# Patient Record
Sex: Female | Born: 1965 | Race: White | Hispanic: No | Marital: Married | Smoking: Former smoker
Health system: Southern US, Community
[De-identification: ages and names within clinical notes are randomized; demographics above are authoritative.]

## PROBLEM LIST (undated history)

## (undated) DIAGNOSIS — E079 Disorder of thyroid, unspecified: Secondary | ICD-10-CM

## (undated) HISTORY — PX: TUBAL LIGATION: SHX77

## (undated) HISTORY — DX: Disorder of thyroid, unspecified: E07.9

## (undated) HISTORY — PX: CARPAL TUNNEL RELEASE: SHX101

## (undated) HISTORY — PX: THYROIDECTOMY: SHX17

---

## 2009-05-10 ENCOUNTER — Ambulatory Visit (HOSPITAL_COMMUNITY): Admission: RE | Admit: 2009-05-10 | Discharge: 2009-05-11 | Payer: Self-pay | Admitting: Specialist

## 2010-02-09 ENCOUNTER — Inpatient Hospital Stay (HOSPITAL_COMMUNITY): Admission: RE | Admit: 2010-02-09 | Discharge: 2010-02-10 | Payer: Self-pay | Admitting: Specialist

## 2011-01-03 LAB — PREGNANCY, URINE: Preg Test, Ur: NEGATIVE

## 2011-01-03 LAB — CBC
HCT: 37 % (ref 36.0–46.0)
Hemoglobin: 12.7 g/dL (ref 12.0–15.0)
MCV: 91.5 fL (ref 78.0–100.0)
Platelets: 255 10*3/uL (ref 150–400)
WBC: 6.9 10*3/uL (ref 4.0–10.5)

## 2011-01-03 LAB — BASIC METABOLIC PANEL
BUN: 17 mg/dL (ref 6–23)
Chloride: 101 mEq/L (ref 96–112)
GFR calc non Af Amer: 57 mL/min — ABNORMAL LOW (ref >60)
Potassium: 4.2 mEq/L (ref 3.5–5.1)
Sodium: 139 mEq/L (ref 135–145)

## 2011-01-22 LAB — BASIC METABOLIC PANEL
BUN: 12 mg/dL (ref 6–23)
CO2: 28 mEq/L (ref 19–32)
Chloride: 105 mEq/L (ref 96–112)
Creatinine, Ser: 0.88 mg/dL (ref 0.4–1.2)
Glucose, Bld: 93 mg/dL (ref 70–99)
Potassium: 4.1 mEq/L (ref 3.5–5.1)

## 2011-01-22 LAB — CBC
HCT: 38.2 % (ref 36.0–46.0)
MCHC: 33.9 g/dL (ref 30.0–36.0)
MCV: 91 fL (ref 78.0–100.0)
Platelets: 255 10*3/uL (ref 150–400)
RDW: 13.9 % (ref 11.5–15.5)
WBC: 7.7 10*3/uL (ref 4.0–10.5)

## 2011-01-22 LAB — PREGNANCY, URINE: Preg Test, Ur: NEGATIVE

## 2011-02-28 NOTE — Op Note (Signed)
Meagan Jimenez, Meagan Jimenez                ACCOUNT NO.:  [id number]   MEDICAL RECORD NO.:  [id number]          PATIENT TYPE:  AMB   LOCATION:  DAY                          FACILITY:  [facility]   PHYSICIAN:  Jene Every, M.D.    DATE OF BIRTH:  Apr 11, 1966   DATE OF PROCEDURE:  05/10/2009  DATE OF DISCHARGE:                               OPERATIVE REPORT   PREOPERATIVE DIAGNOSIS:  Rotator cuff tear massive, of left shoulder.   POSTOPERATIVE DIAGNOSIS:  Rotator cuff tear massive, of left shoulder.   PROCEDURE PERFORMED:  1. Mini open repair of massive rotator cuff tear utilizing a striker      suture anchors and push lock.  2. Subacromial decompression.  3. Bursectomy.   ANESTHESIA:  General.   SURGEON:  Jene Every, M.D.   ASSISTANT:  Roma Schanz, P.A.   TECHNIQUE:  The operation's degree of difficulty increased to the  patient's extreme morbid obesity and a massive chronic tear.  The  patient in supine beach-chair position after induction of adequate  general anesthesia and 1 gram of vanco, left shoulder and upper  extremity was prepped and draped in the usual sterile fashion.  Incision  was made over the anterolateral aspect of the acromion.  Subcutaneous  tissue was dissected.  Electrocautery utilized to achieve hemostasis.  The raphe between the anterolateral heads was identified,  subperiosteally elevated from the anterolateral and anteromedial aspect  of the acromion preserving the attachment of the deltoid.  CA ligament  was identified and divided, acromioplasty performed with a oscillating  saw.  Removed with a 3 mm Kerrison.  Hypertrophic bursa was excised.  There was a massive complex tear of the rotator cuff included subscap,  supraspinatus, infraspinatus.  The edges were trimmed.  We mobilized the  tissue as best possible.  I created a bleeding bed medial to the greater  tuberosity, had to remove some of the articular surface approximately  0.5 cm due to the minimal  advancement of the tendon that occurred due to  the chronicity.  We did release some of the tendon at the glenohumeral  labrum.  This was gently mobilized.  We digitally lysed adhesions.  Then  put 3 suture anchors at the articular margin from just lateral to the  bicipital groove, middle substance of the supraspinatus and laterally  for the infraspinatus.  Tendon was advanced into the trough.  We tied a  suture knot.  We tied it each over the suture anchor with a good  surgeon's knot.  We then saved the suture following that, crossed them  and put them into push lock suture anchors over the lateral aspect of  the greater tuberosity.  The suture anchors at the articular margin  wounds were placed utilizing awl insertion of the suture anchor.  Excellent purchase was obtained.  Same with the push locks.  The crossed  threaded suture ends were then placed through the push lock and into the  lower portion of the outer aspect of the greater tuberosity with  appropriate tensioning.  This covered the bed that was made and brought  the tendon down to a good watertight seal.  Good tension from utilizing  the push locks was done and the suture was then removed.  We ranged the  shoulder and there was good range, good attachment of the rotator cuff  tendon.  Wound copiously irrigated.  Excess bursa was excised.  There  was significant degree of difficulty added due to the patient's obesity  and the retracted tear and need for mobilization of the cuff.  When we  were finished, we had good coverage.  Next, the wound was copiously  irrigated.  We repaired the raphe with #1 Vicryl interrupted figure-of-  eight sutures over top through the acromion, subcu with 2-0 Vicryl  simple sutures.  Skin was reapproximated with 4-0 subcuticular Prolene.  Wound reinforced with Steri-Strips.  Sterile dressing applied.  Placed  in a sling,abduction pillow, extubated without difficulty and  transported to recovery in  satisfactory condition.   The patient tolerated the procedure well.  There were no complications.   MINIMAL BLOOD LOSS:  50 mL.      Jene Every, M.D.  Electronically Signed     JB/MEDQ  D:  05/10/2009  T:  05/11/2009  Job:  [id number]

## 2012-01-09 ENCOUNTER — Other Ambulatory Visit (HOSPITAL_COMMUNITY): Payer: Self-pay | Admitting: Orthopedic Surgery

## 2012-01-09 DIAGNOSIS — S46009A Unspecified injury of muscle(s) and tendon(s) of the rotator cuff of unspecified shoulder, initial encounter: Secondary | ICD-10-CM

## 2012-01-12 ENCOUNTER — Ambulatory Visit (HOSPITAL_COMMUNITY)
Admission: RE | Admit: 2012-01-12 | Discharge: 2012-01-12 | Disposition: A | Discharge status: Home / Self Care | Payer: Worker's Compensation | Source: Ambulatory Visit | Attending: Orthopedic Surgery | Admitting: Orthopedic Surgery

## 2012-01-12 DIAGNOSIS — S46009A Unspecified injury of muscle(s) and tendon(s) of the rotator cuff of unspecified shoulder, initial encounter: Secondary | ICD-10-CM

## 2012-01-12 DIAGNOSIS — S4980XA Other specified injuries of shoulder and upper arm, unspecified arm, initial encounter: Secondary | ICD-10-CM | POA: Insufficient documentation

## 2012-01-12 DIAGNOSIS — X58XXXA Exposure to other specified factors, initial encounter: Secondary | ICD-10-CM | POA: Insufficient documentation

## 2012-01-12 DIAGNOSIS — S46909A Unspecified injury of unspecified muscle, fascia and tendon at shoulder and upper arm level, unspecified arm, initial encounter: Secondary | ICD-10-CM | POA: Insufficient documentation

## 2012-01-12 MED ORDER — IOHEXOL 300 MG/ML  SOLN
10.0000 mL | Freq: Once | INTRAMUSCULAR | Status: AC | PRN
  Administered 2012-01-12: 10 mL via INTRATHECAL

## 2012-01-12 MED ORDER — GADOBENATE DIMEGLUMINE 529 MG/ML IV SOLN
0.5000 mL | Freq: Once | INTRAVENOUS | Status: AC | PRN
  Administered 2012-01-12: 0.5 mL via INTRAVENOUS

## 2012-01-12 NOTE — Procedures (Signed)
Left shoulder injection for MRI.  Full report dictated.

## 2012-01-15 ENCOUNTER — Telehealth (HOSPITAL_COMMUNITY): Payer: Self-pay

## 2013-06-09 ENCOUNTER — Telehealth: Payer: Self-pay | Admitting: *Deleted

## 2013-06-09 NOTE — Telephone Encounter (Signed)
pts husband called about a refill for the citolapram, he said you have been prescribing for her, I called her pharmacy and they said it was written by you back in October of 2013, I asked you about it Thursday and you said for her to get it from her primary Dr. Quincy Carnes say she has no primary Dr. and would appreciate it if you will fill it again, please advise.

## 2013-06-10 ENCOUNTER — Other Ambulatory Visit: Payer: Self-pay | Admitting: *Deleted

## 2013-06-10 MED ORDER — CITALOPRAM HYDROBROMIDE 40 MG PO TABS: 60.0000 mg | ORAL_TABLET | Freq: Every day | ORAL | Status: DC

## 2013-06-10 NOTE — Telephone Encounter (Signed)
Ok, she needs her annual f/u scheduled with TSH level in October and records requested

## 2013-08-12 ENCOUNTER — Other Ambulatory Visit: Payer: Self-pay

## 2013-08-14 ENCOUNTER — Ambulatory Visit: Payer: Self-pay | Admitting: Endocrinology

## 2013-08-19 ENCOUNTER — Other Ambulatory Visit: Payer: Self-pay

## 2013-08-21 ENCOUNTER — Ambulatory Visit: Payer: Self-pay | Admitting: Endocrinology

## 2013-08-31 IMAGING — RF DG FLUORO GUIDE NDL PLC/BX
6 series · 6 of 6 positions shown · IV contrast (multihance)
Comparison: none

Clincial Data: Recurrent shoulder pain and weakness. History of
shoulder surgery x2 for rotator cuff tear.

LEFT SHOULDER INJECTION FOR MRI UNDER FLUOROSCOPY
TECHNIQUE: Time-out procedure was performed.  The procedure and
potential complications (bleeding, infection, contrast reaction)
were discussed with the patient.  Informed written consent was
obtained.  An appropriate skin entrance site was determined. The
site was marked, prepped with Betadine, draped in the usual sterile
fashion, and infiltrated locally with 1% Lidocaine.  A 22 gauge
spinal needle was advanced to the superomedial margin of the
humeral head under intermittent fluoroscopy.   10 ml of a mixture
of 0.06 ml Multihance in 10 ml of Fmnipaque-CSS and 2 ml of 1%
Lidocaine was then used to opacify the left shoulder capsule.
There were no immediate complications.
Fluoroscopy time: 0.5 minutes.

[Series 1: run · 1 of 1 slices shown (1 of 6)]
[im 1/1]
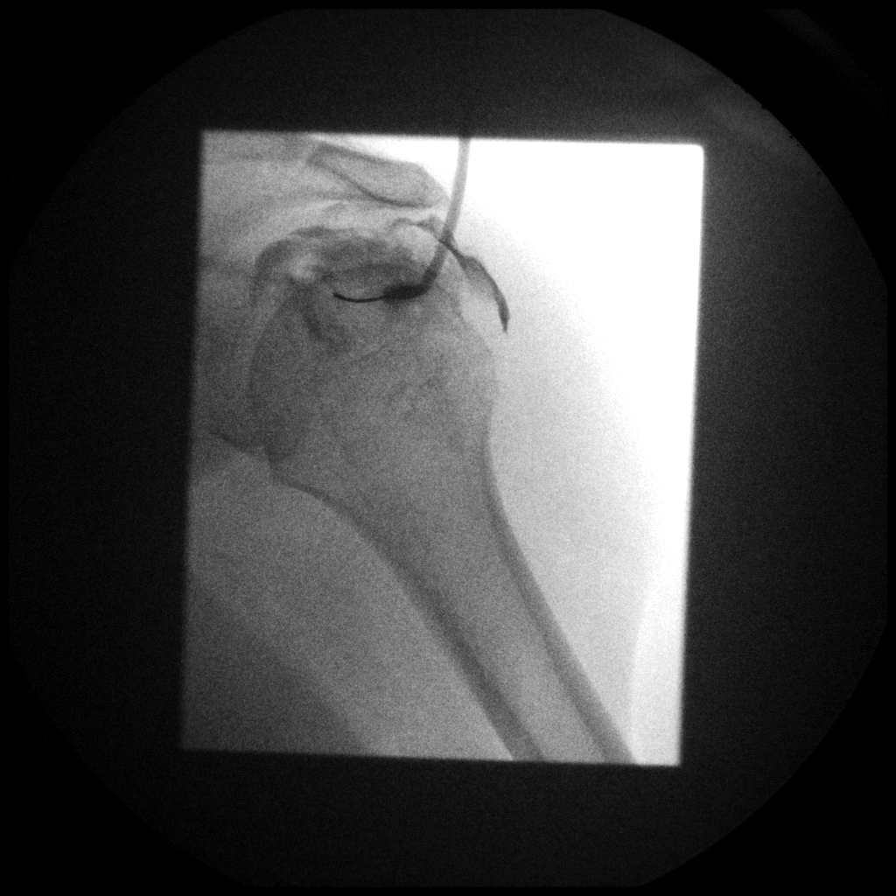

[Series 2: run · 1 of 1 slices shown (2 of 6)]
[im 1/1]
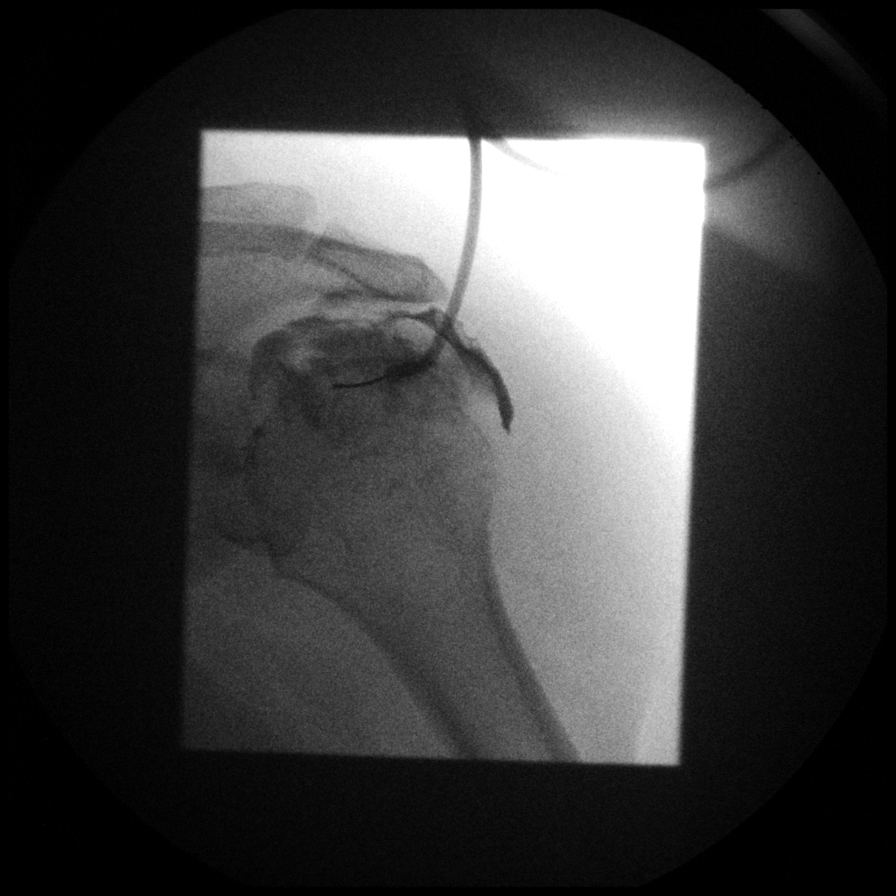

[Series 3: run · 1 of 1 slices shown (3 of 6)]
[im 1/1]
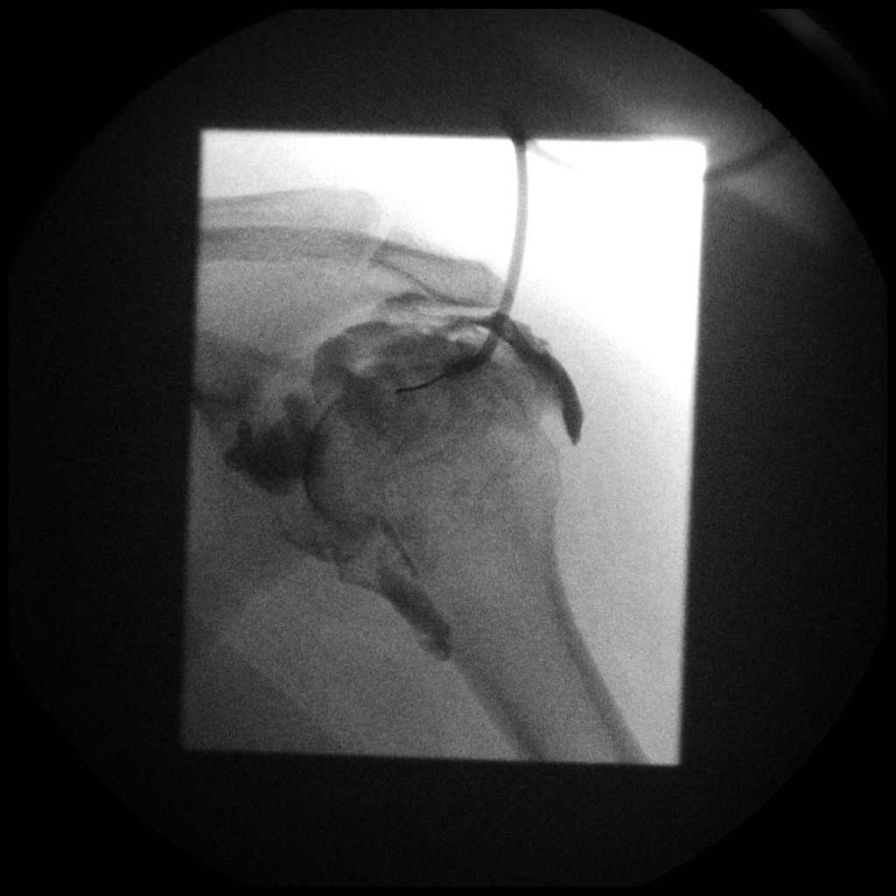

[Series 4: run · 1 of 1 slices shown (4 of 6)]
[im 1/1]
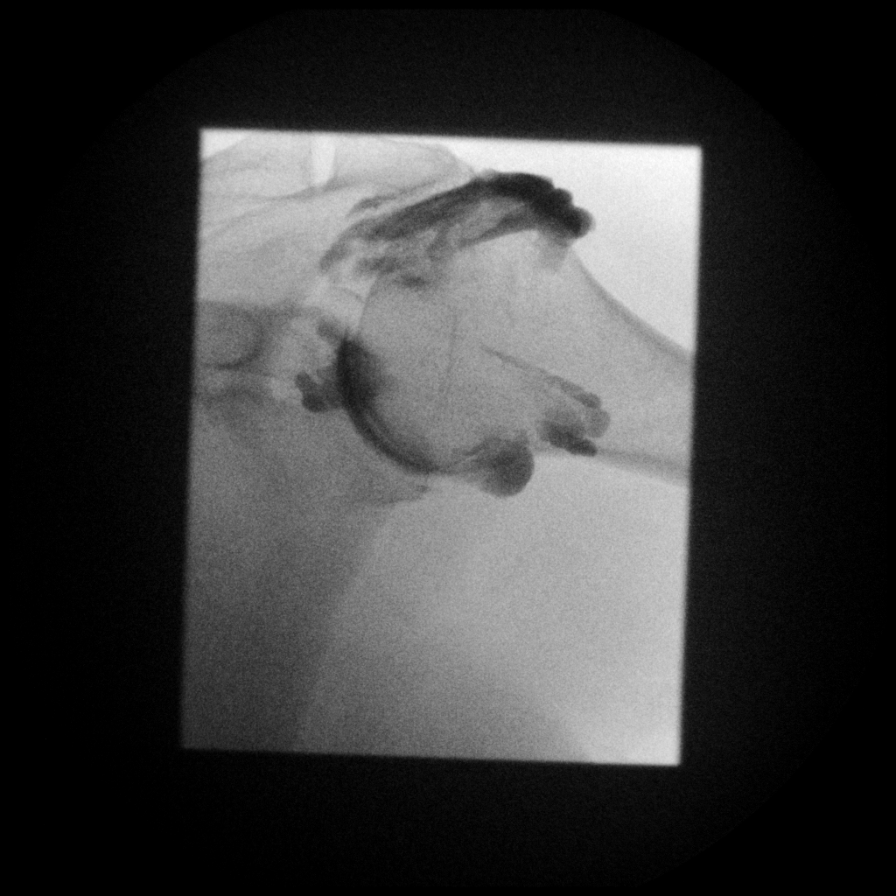

[Series 5: run · 1 of 1 slices shown (5 of 6)]
[im 1/1]
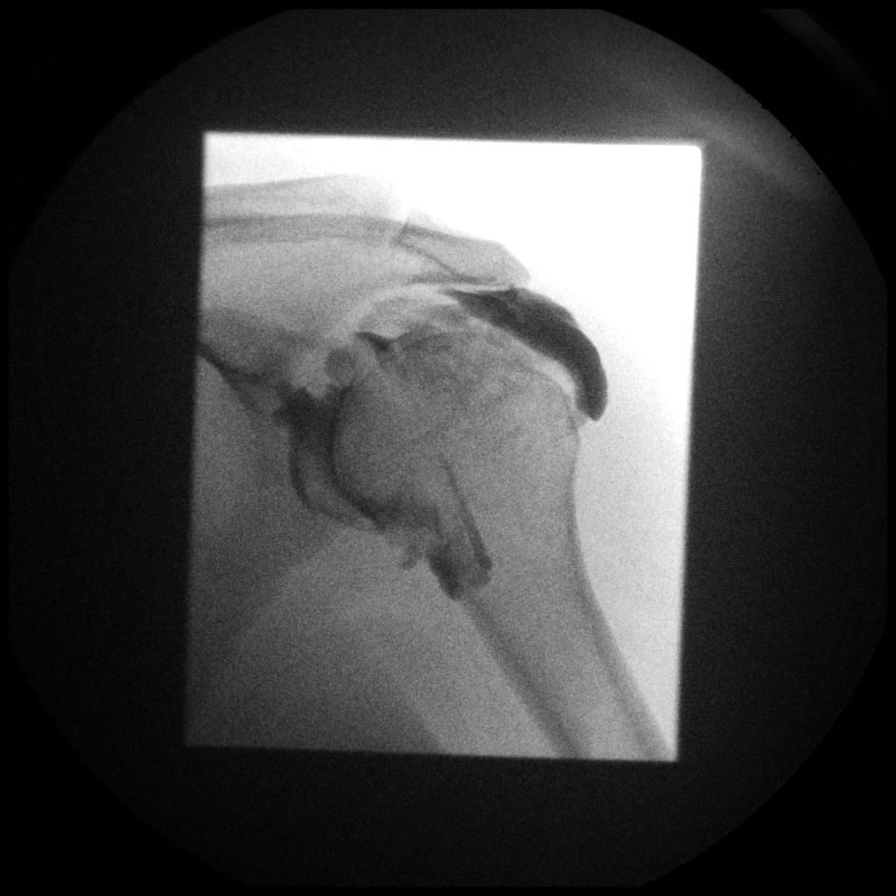

[Series 6: run · 1 of 1 slices shown (6 of 6)]
[im 1/1]
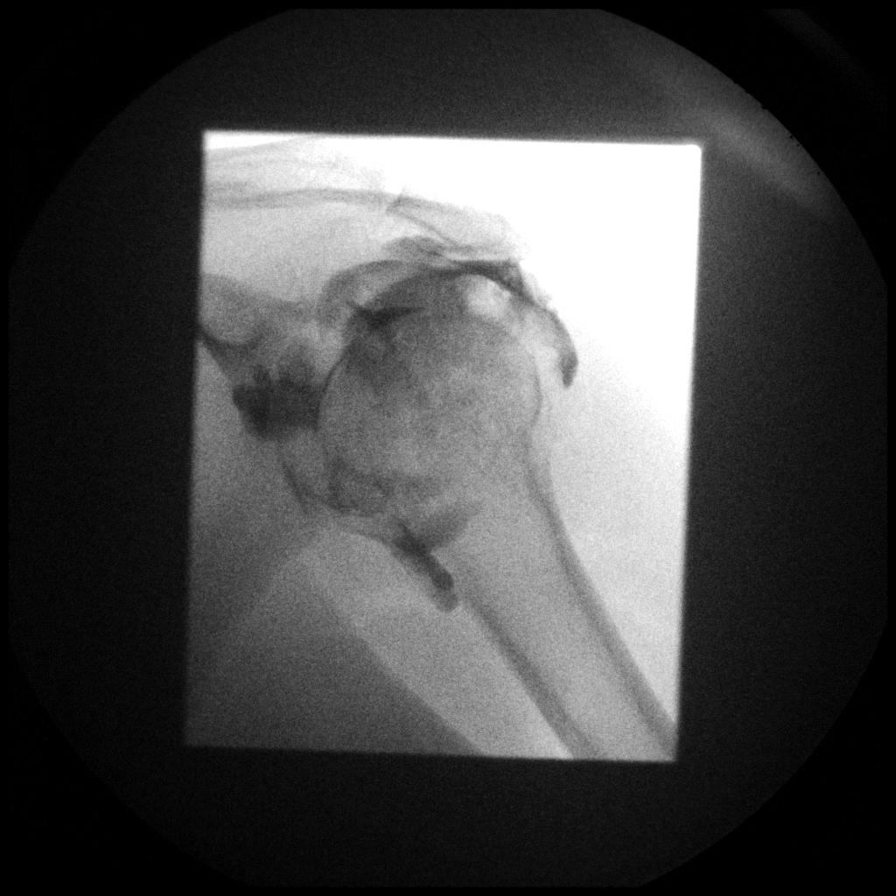

[6 of 6 positions shown; findings below may reference images not displayed]

IMPRESSION: Technically successful left shoulder injection for MRI.  The
immediate fluoroscopic images demonstrate rapid passage of contrast
from the glenohumeral joint into the subacromial space.  Images
obtained after needle removal demonstrate contrast dispersed
throughout the glenohumeral joint and subacromial - subdeltoid
bursa.

## 2013-09-17 ENCOUNTER — Other Ambulatory Visit: Payer: Self-pay

## 2013-09-17 ENCOUNTER — Other Ambulatory Visit: Payer: Self-pay | Admitting: *Deleted

## 2013-09-17 ENCOUNTER — Other Ambulatory Visit (INDEPENDENT_AMBULATORY_CARE_PROVIDER_SITE_OTHER): Payer: Self-pay

## 2013-09-17 DIAGNOSIS — E039 Hypothyroidism, unspecified: Secondary | ICD-10-CM

## 2013-09-19 ENCOUNTER — Encounter: Payer: Self-pay | Admitting: Endocrinology

## 2013-09-19 ENCOUNTER — Ambulatory Visit (INDEPENDENT_AMBULATORY_CARE_PROVIDER_SITE_OTHER): Payer: Self-pay | Admitting: Endocrinology

## 2013-09-19 VITALS — BP 148/98 | HR 79 | Temp 98.6°F | Resp 14 | Ht 65.0 in | Wt 321.5 lb

## 2013-09-19 DIAGNOSIS — R030 Elevated blood-pressure reading, without diagnosis of hypertension: Secondary | ICD-10-CM

## 2013-09-19 DIAGNOSIS — E039 Hypothyroidism, unspecified: Secondary | ICD-10-CM

## 2013-09-19 DIAGNOSIS — R7309 Other abnormal glucose: Secondary | ICD-10-CM

## 2013-09-19 DIAGNOSIS — R7303 Prediabetes: Secondary | ICD-10-CM

## 2013-09-19 LAB — BASIC METABOLIC PANEL
BUN: 13 mg/dL (ref 6–23)
CO2: 26 mEq/L (ref 19–32)
Calcium: 8.2 mg/dL — ABNORMAL LOW (ref 8.4–10.5)
Chloride: 103 mEq/L (ref 96–112)
Creatinine, Ser: 0.8 mg/dL (ref 0.4–1.2)
Glucose, Bld: 115 mg/dL — ABNORMAL HIGH (ref 70–99)

## 2013-09-19 MED ORDER — LEVOTHYROXINE SODIUM 200 MCG PO TABS: 200.0000 ug | ORAL_TABLET | Freq: Every day | ORAL | Status: DC

## 2013-09-19 MED ORDER — GABAPENTIN 300 MG PO CAPS: 300.0000 mg | ORAL_CAPSULE | Freq: Three times a day (TID) | ORAL | Status: DC

## 2013-09-19 MED ORDER — RAMIPRIL 5 MG PO CAPS: 5.0000 mg | ORAL_CAPSULE | Freq: Every day | ORAL | Status: DC

## 2013-09-19 NOTE — Patient Instructions (Addendum)
Gabapentin 300mg   upto 3x daily  Ramipril 5mg  daily for BP

## 2013-09-19 NOTE — Progress Notes (Signed)
Patient ID: Meagan Jimenez, female   DOB: 11-Nov-1965, 47 y.o.   MRN: [id number]   Reason for Appointment:  Hypothyroidism, followup visit    History of Present Illness:   The hypothyroidism was first diagnosed in 2009 She had total thyroidectomy in 8/09 because of a goiter which was very uncomfortable.  The patient has been treated with generic Synthroid and has been on a stable dose of 200 mcg for some time Recent records are not available   Patient has no complaints of unusual fatigue, cold sensitivity, dry skin, unusual weight gain or hair loss.            The patient is taking the thyroid supplement very regularly in the morning before breakfast. Not taking any calcium or iron supplements with the thyroid supplement.    On the last visit the dose was not changed  Appointment on 09/17/2013  Component Date Value Range Status  . TSH 09/17/2013 1.33  0.35 - 5.50 uIU/mL Final  . Free T4 09/17/2013 0.99  0.60 - 1.60 ng/dL Final   PROBLEM 2: She says that in the past she would have symptoms suggestive of hypoglycemia. Over the last few months she has been checking her glucose at home because of family history of diabetes. Using Embrace meter with mail-order strips which her husband was using. She thinks that occasionally blood sugars are over 200 but other times near normal. Checking sporadically and none in the last week. She thinks she has had increased thirst and frequent urination at night but no recent weight loss or blurred vision She usually drinking diet drinks but sometimes drinking fruit punch  Glucose in the office today is 131, 2 hours after eating a breakfast sandwich  PROBLEM 3: She has had neuropathy for a few years. She has numbness and discomfort in her feet and hands. She has difficulty walking because of pain in her feet and has numbness in her toes with very little sensation distally Unable to work currently She has not seen a neurologist May have taking gabapentin  previously from her PCP  Problem 4: She still has not found a primary care physician for her general medical care    Medication List       This list is accurate as of: 09/19/13 11:02 AM.  Always use your most recent med list.               citalopram 40 MG tablet  Commonly known as:  CELEXA  Take 1.5 tablets (60 mg total) by mouth daily.     levothyroxine 200 MCG tablet  Commonly known as:  SYNTHROID, LEVOTHROID  Take 200 mcg by mouth daily before breakfast.        Allergies:  Allergies  Allergen Reactions  . Aspirin Nausea Only  . Ciprocinonide [Fluocinolone]     Causes hallucinations  . Penicillins Hives    History reviewed. No pertinent past medical history.  No past surgical history on file.  No family history on file.  Social History:  reports that she quit smoking about 8 years ago. She has never used smokeless tobacco. Her alcohol and drug histories are not on file.  REVIEW Of SYSTEMS:  Psoriatic arthritis  No history of hypertension but her blood pressure is unusually high today   Examination:   BP 148/98  Pulse 79  Temp(Src) 98.6 F (37 C)  Resp 14  Ht 5\' 5"  (1.651 m)  Wt 321 lb 8 oz (145.831 kg)  BMI 53.50 kg/m2  SpO2 96%  LMP 09/12/2013 Blood pressure unchanged on repeat measurement GENERAL APPEARANCE: Alert, no puffiness of the face or eyes     NEUROLOGIC EXAM:  biceps reflexes show normal relaxation Skin: Not unusual dry    Assessment   Hypothyroidism, postsurgical with normal TSH  Not clear if she has early diabetes, glucose is fairly good today.  She may not have an accurate glucose meter at home.  Her neuropathy may be from diabetes and need symptomatic control  Hypertension, currently untreated   Treatment:  Synthroid dose to be continued unchanged before breakfast daily.   Trial of gabapentin for symptomatic relief of her neuropathy  Check A1c Check basic metabolic panel  Start ramipril 5 mg daily for hypertension,  will need followup  Again recommended that she get a primary care physician for general care  [facility] 09/19/2013, 11:02 AM

## 2013-09-30 ENCOUNTER — Other Ambulatory Visit: Payer: Self-pay | Admitting: Endocrinology

## 2014-05-15 ENCOUNTER — Other Ambulatory Visit: Payer: Self-pay | Admitting: *Deleted

## 2014-05-15 ENCOUNTER — Telehealth: Payer: Self-pay | Admitting: *Deleted

## 2014-05-15 NOTE — Telephone Encounter (Signed)
Patient requested refill of citolapram and levothyroxine, rx was denied, patient needs to be seen in office for refills

## 2014-05-20 ENCOUNTER — Telehealth: Payer: Self-pay | Admitting: Endocrinology

## 2014-05-20 NOTE — Telephone Encounter (Signed)
Patient cannot get refills until she is seen in office, she's way past due for O/V, rx faxed to pharmacy letting them know.

## 2014-05-20 NOTE — Telephone Encounter (Signed)
Patient would like a refill on meds Levothyroxine 200 mg and Citalopram 40 mg, he stated that pharmacy faxed over request for meds but haven't heard anything back.

## 2014-05-22 ENCOUNTER — Ambulatory Visit (INDEPENDENT_AMBULATORY_CARE_PROVIDER_SITE_OTHER): Payer: Worker's Compensation | Admitting: Endocrinology

## 2014-05-22 ENCOUNTER — Encounter: Payer: Self-pay | Admitting: Endocrinology

## 2014-05-22 VITALS — BP 120/89 | HR 73 | Temp 98.3°F | Resp 16 | Ht 65.0 in | Wt 311.8 lb

## 2014-05-22 DIAGNOSIS — E039 Hypothyroidism, unspecified: Secondary | ICD-10-CM

## 2014-05-22 DIAGNOSIS — R1013 Epigastric pain: Secondary | ICD-10-CM

## 2014-05-22 LAB — CBC
HCT: 40.4 % (ref 36.0–46.0)
Hemoglobin: 13.4 g/dL (ref 12.0–15.0)
MCHC: 33.2 g/dL (ref 30.0–36.0)
MCV: 86.8 fl (ref 78.0–100.0)
Platelets: 243 10*3/uL (ref 150.0–400.0)
RBC: 4.65 Mil/uL (ref 3.87–5.11)
RDW: 15.4 % (ref 11.5–15.5)
WBC: 6.9 10*3/uL (ref 4.0–10.5)

## 2014-05-22 LAB — TSH: TSH: 0.9 u[IU]/mL (ref 0.35–4.50)

## 2014-05-22 NOTE — Patient Instructions (Signed)
OTC Nexium/ Prilosec or Prevacid  Vitamin D3, 2000 units daily

## 2014-05-22 NOTE — Progress Notes (Signed)
Patient ID: Meagan Jimenez, female   DOB: 1966/01/14, 48 y.o.   MRN: [id number]   Reason for Appointment:  Hypothyroidism, followup visit    History of Present Illness:   The hypothyroidism was first diagnosed in 2009 She had total thyroidectomy in 8/09 because of a multinodular goiter which was causing local discomfort  The patient has been treated with generic Synthroid and has been on a stable dose of 200 mcg for some time She complains of feeling more tired for the last 3 months Recent weight gain not present   The patient is taking the thyroid supplement very regularly in the morning before breakfast. Not taking any calcium or iron supplements with the thyroid supplement.    On the last visit the dose was not changed  Wt Readings from Last 3 Encounters:  05/22/14 311 lb 12.8 oz (141.432 kg)  09/19/13 321 lb 8 oz (145.831 kg)    Lab Results  Component Value Date   FREET4 0.99 09/17/2013   TSH 1.33 09/17/2013     No visits with results within 1 Week(s) from this visit. Latest known visit with results is:  Office Visit on 09/19/2013  Component Date Value Ref Range Status  . Hemoglobin A1C 09/19/2013 6.8* 4.6 - 6.5 % Final   Glycemic Control Guidelines for People with Diabetes:Non Diabetic:  <6%Goal of Therapy: <7%Additional Action Suggested:  >8%   . Sodium 09/19/2013 138  135 - 145 mEq/L Final  . Potassium 09/19/2013 3.9  3.5 - 5.1 mEq/L Final  . Chloride 09/19/2013 103  96 - 112 mEq/L Final  . CO2 09/19/2013 26  19 - 32 mEq/L Final  . Glucose, Bld 09/19/2013 115* 70 - 99 mg/dL Final  . BUN 40/98/119112/02/2013 13  6 - 23 mg/dL Final  . Creatinine, Ser 09/19/2013 0.8  0.4 - 1.2 mg/dL Final  . Calcium 47/82/956212/02/2013 8.2* 8.4 - 10.5 mg/dL Final  . GFR 13/08/657812/02/2013 85.93  >60.00 mL/min Final  . POC Glucose 09/19/2013 131* 70 - 99 mg/dl Final       Medication List       This list is accurate as of: 05/22/14  1:24 PM.  Always use your most recent med list.               citalopram 40 MG tablet  Commonly known as:  CELEXA  TAKE ONE & ONE-HALF TABLETS BY MOUTH ONCE DAILY     gabapentin 300 MG capsule  Commonly known as:  NEURONTIN  Take 1 capsule (300 mg total) by mouth 3 (three) times daily.     levothyroxine 200 MCG tablet  Commonly known as:  SYNTHROID, LEVOTHROID  Take 1 tablet (200 mcg total) by mouth daily before breakfast.     ramipril 5 MG capsule  Commonly known as:  ALTACE  Take 1 capsule (5 mg total) by mouth daily.        Allergies:  Allergies  Allergen Reactions  . Aspirin Nausea Only  . Ciprocinonide [Fluocinolone]     Causes hallucinations  . Penicillins Hives    Past Medical History  Diagnosis Date  . Thyroid disease     Past Surgical History  Procedure Laterality Date  . Tubal ligation    . Thyroidectomy    . Carpal tunnel release      Family History  Problem Relation Age of Onset  . Diabetes Mother   . Heart disease Mother   . Diabetes Father   . Heart disease Father   .  Diabetes Sister   . Heart disease Paternal Grandfather   . Diabetes Paternal Grandfather   . Diabetes Maternal Grandmother   . Heart disease Maternal Grandmother   . Diabetes Maternal Grandfather   . Diabetes Paternal Grandmother   . Heart disease Paternal Grandmother     Social History:  reports that she quit smoking about 9 years ago. She has never used smokeless tobacco. Her alcohol and drug histories are not on file.  REVIEW Of SYSTEMS:  Epigastric pain for the last month or so, worse lying down and not affected by food. Somewhat worse when she is driving in a car and has not found any relieving factors. No nausea or vomiting. May have some radiation to the sides  She is also had on and off rectal bleeding for the last 6 months and has not discussed this with her physician  Psoriatic arthritis present  She has a history of hypertension and her blood pressure is relatively high today, not taking ramipril regularly  Probable mild  diabetes with A1c 6.8  She has had neuropathy for a few years. She has numbness and discomfort in her feet and hands. She has difficulty walking because of pain in her feet and has numbness in her toes with very little sensation distally She  has been given gabapentin  On her last lab she had mild hypocalcemia   She still has not found a primary care physician for her general medical care   Examination:   BP 120/89  Pulse 73  Temp(Src) 98.3 F (36.8 C)  Resp 16  Ht 5\' 5"  (1.651 m)  Wt 311 lb 12.8 oz (141.432 kg)  BMI 51.89 kg/m2  SpO2 95%  GENERAL APPEARANCE: Alert, no puffiness of the face or eyes    She has mild epigastric tenderness, no mass palpable  NEUROLOGIC EXAM:  biceps reflexes show normal relaxation Skin: Not unusual dry    Assessment   Hypothyroidism, postsurgical on supplemental therapy Will need to check TSH  Abdominal pain: Not clear if she is having gastritis or other symptom possibly chronic pancreatitis  She needs to be evaluated by a gastroenterologist. Meanwhile she can try OTC Nexium or equivalent drug Also will check hemoglobin  Hypertension, needs to take ramipril regularly   Treatment:  Synthroid dose to be determined when labs are available  Continue ramipril 5 mg daily for hypertension, will need followup  Again recommended that she get a primary care physician for general care  Start vitamin D preventive because of history of hypocalcemia  Jaishawn Witzke 05/22/2014, 1:24 PM   Addendum: TSH normal  Office Visit on 05/22/2014  Component Date Value Ref Range Status  . TSH 05/22/2014 0.90  0.35 - 4.50 uIU/mL Final  . WBC 05/22/2014 6.9  4.0 - 10.5 K/uL Final  . RBC 05/22/2014 4.65  3.87 - 5.11 Mil/uL Final  . Platelets 05/22/2014 243.0  150.0 - 400.0 K/uL Final  . Hemoglobin 05/22/2014 13.4  12.0 - 15.0 g/dL Final  . HCT 96/01/5408 40.4  36.0 - 46.0 % Final  . MCV 05/22/2014 86.8  78.0 - 100.0 fl Final  . MCHC 05/22/2014 33.2  30.0 - 36.0  g/dL Final  . RDW 81/19/1478 15.4  11.5 - 15.5 % Final

## 2014-05-24 NOTE — Progress Notes (Signed)
Quick Note:  Please let patient know that the thyroid and hemoglobin result is normal and no change action needed ______

## 2014-05-25 ENCOUNTER — Other Ambulatory Visit: Payer: Self-pay | Admitting: *Deleted

## 2014-05-25 ENCOUNTER — Other Ambulatory Visit: Payer: Self-pay | Admitting: Endocrinology

## 2014-05-25 ENCOUNTER — Encounter: Payer: Self-pay | Admitting: *Deleted

## 2014-05-25 MED ORDER — LEVOTHYROXINE SODIUM 200 MCG PO TABS: 200.0000 ug | ORAL_TABLET | Freq: Every day | ORAL | Status: DC

## 2014-05-26 ENCOUNTER — Other Ambulatory Visit: Payer: Self-pay | Admitting: *Deleted

## 2014-05-26 ENCOUNTER — Telehealth: Payer: Self-pay | Admitting: Endocrinology

## 2014-05-26 MED ORDER — GABAPENTIN 300 MG PO CAPS: 300.0000 mg | ORAL_CAPSULE | Freq: Three times a day (TID) | ORAL | Status: DC

## 2014-05-26 MED ORDER — CITALOPRAM HYDROBROMIDE 40 MG PO TABS: ORAL_TABLET | ORAL | Status: DC

## 2014-05-26 MED ORDER — LEVOTHYROXINE SODIUM 200 MCG PO TABS: 200.0000 ug | ORAL_TABLET | Freq: Every day | ORAL | Status: DC

## 2014-05-26 MED ORDER — RAMIPRIL 5 MG PO CAPS: 5.0000 mg | ORAL_CAPSULE | Freq: Every day | ORAL | Status: DC

## 2014-05-26 NOTE — Telephone Encounter (Signed)
4 rx's sent to patients pharmacy.

## 2014-05-26 NOTE — Telephone Encounter (Signed)
Pt needs all rx refilled please sent to [facility] in [place]

## 2014-05-26 NOTE — Telephone Encounter (Signed)
Patient [facility] [place] on [address] it is a lot cheaper.

## 2014-05-26 NOTE — Telephone Encounter (Signed)
rx's resent to [facility] [place].

## 2014-06-17 ENCOUNTER — Encounter: Payer: Self-pay | Admitting: Endocrinology

## 2014-06-26 ENCOUNTER — Encounter: Payer: Self-pay | Admitting: Endocrinology

## 2014-10-29 ENCOUNTER — Other Ambulatory Visit: Payer: Self-pay | Admitting: Endocrinology

## 2014-12-02 ENCOUNTER — Telehealth: Payer: Self-pay | Admitting: Endocrinology

## 2014-12-02 NOTE — Telephone Encounter (Signed)
[facility] [address]

## 2014-12-02 NOTE — Telephone Encounter (Signed)
Unable to reach pt. Will try again at a later time.  

## 2014-12-02 NOTE — Telephone Encounter (Signed)
Chart says pt is on gabapentin, not lyrica

## 2014-12-02 NOTE — Telephone Encounter (Signed)
Pt requesting a refill of Lyrica. Please advise in Dr Remus BlakeKumar's absence.

## 2014-12-03 NOTE — Telephone Encounter (Signed)
Tried to contact pt and discuss. Number is busy. Will try again at a later time.

## 2014-12-03 NOTE — Telephone Encounter (Signed)
Pt's number is still busy. Will try again at a later time.

## 2014-12-04 ENCOUNTER — Other Ambulatory Visit: Payer: Self-pay | Admitting: *Deleted

## 2014-12-04 MED ORDER — GABAPENTIN 300 MG PO CAPS: 300.0000 mg | ORAL_CAPSULE | Freq: Three times a day (TID) | ORAL | Status: AC

## 2014-12-04 NOTE — Telephone Encounter (Signed)
rx sent for gabapentin 

## 2014-12-04 NOTE — Telephone Encounter (Signed)
See below message from Dr. Everardo AllEllison. I have tried to contact the pt multiple times and have been unsuccessful. Thanks!

## 2015-02-16 ENCOUNTER — Other Ambulatory Visit: Payer: Self-pay | Admitting: Endocrinology

## 2015-02-23 ENCOUNTER — Other Ambulatory Visit: Payer: Self-pay

## 2015-02-23 MED ORDER — RAMIPRIL 5 MG PO CAPS: 5.0000 mg | ORAL_CAPSULE | Freq: Every day | ORAL | Status: AC

## 2015-05-30 ENCOUNTER — Other Ambulatory Visit: Payer: Self-pay | Admitting: Endocrinology

## 2015-06-26 ENCOUNTER — Other Ambulatory Visit: Payer: Self-pay | Admitting: Endocrinology

## 2015-07-20 ENCOUNTER — Other Ambulatory Visit: Payer: Self-pay | Admitting: Endocrinology

## 2015-07-21 ENCOUNTER — Telehealth: Payer: Self-pay | Admitting: *Deleted

## 2015-07-21 NOTE — Telephone Encounter (Signed)
Patient is unable to get here for labs before her appointment, they have no car and are trying to find someone to take them.

## 2015-07-21 NOTE — Telephone Encounter (Signed)
She does need to get a PCP Needs to have TSH, A1c, CMP done preferably before her office visit

## 2015-07-21 NOTE — Telephone Encounter (Signed)
Patients Husband called requesting a refill of his wifes Citolapram, I explained to him that it's been well over a year since she's been seen and no refills could be given until she was seen, he made her an appt for 10/06.

## 2015-07-22 ENCOUNTER — Ambulatory Visit: Payer: Self-pay | Admitting: Endocrinology

## 2015-07-24 ENCOUNTER — Other Ambulatory Visit: Payer: Self-pay | Admitting: Endocrinology

## 2015-09-24 ENCOUNTER — Other Ambulatory Visit: Payer: Self-pay | Admitting: Endocrinology

## 2015-09-29 ENCOUNTER — Telehealth: Payer: Self-pay | Admitting: *Deleted

## 2015-09-29 ENCOUNTER — Other Ambulatory Visit: Payer: Self-pay | Admitting: Endocrinology

## 2015-09-29 NOTE — Telephone Encounter (Signed)
Patient keeps requesting refills of her levothyroxine, since she has not been seen here since August of 2015.  Rx denied. Patient is aware that she needs to be seen before she can get refills.

## 2015-11-10 ENCOUNTER — Other Ambulatory Visit: Payer: Self-pay | Admitting: Endocrinology

## 2020-11-16 DEATH — deceased
